# Patient Record
Sex: Female | Born: 1935 | Hispanic: Yes | Marital: Married | Smoking: Never smoker
Health system: Southern US, Community
[De-identification: ages and names within clinical notes are randomized; demographics above are authoritative.]

## PROBLEM LIST (undated history)

## (undated) DIAGNOSIS — I10 Essential (primary) hypertension: Secondary | ICD-10-CM

## (undated) DIAGNOSIS — K219 Gastro-esophageal reflux disease without esophagitis: Secondary | ICD-10-CM

---

## 2004-01-19 ENCOUNTER — Other Ambulatory Visit: Payer: Self-pay

## 2009-05-24 ENCOUNTER — Ambulatory Visit: Payer: Self-pay | Admitting: Family Medicine

## 2011-03-31 IMAGING — US US PELV - US TRANSVAGINAL
1 series · 17 of 25 positions shown · non-contrast
Comparison: none

REASON FOR EXAM: P/M Bleeding
COMMENTS:

[Series 1: us pelv - us transvaginal · 17 of 55 slices shown]
[im 1/55]
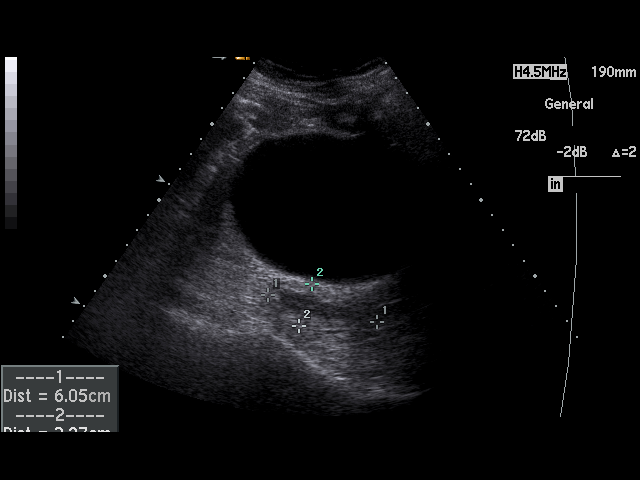
[im 5/55]
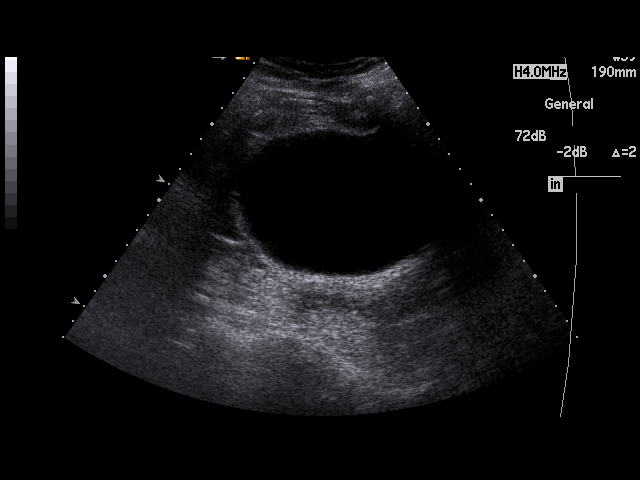
[im 7/55]
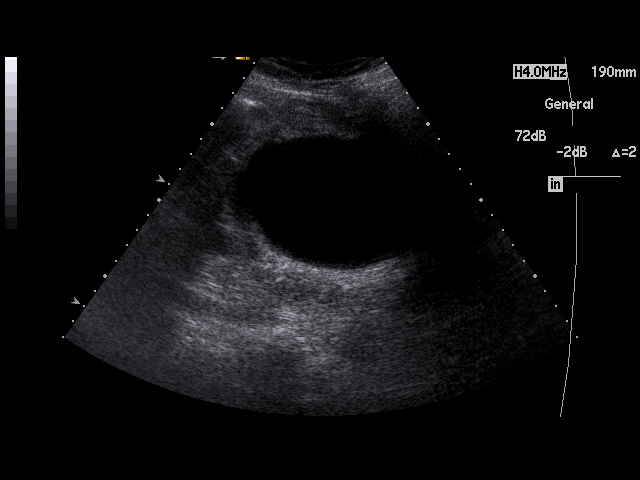
[im 12/55]
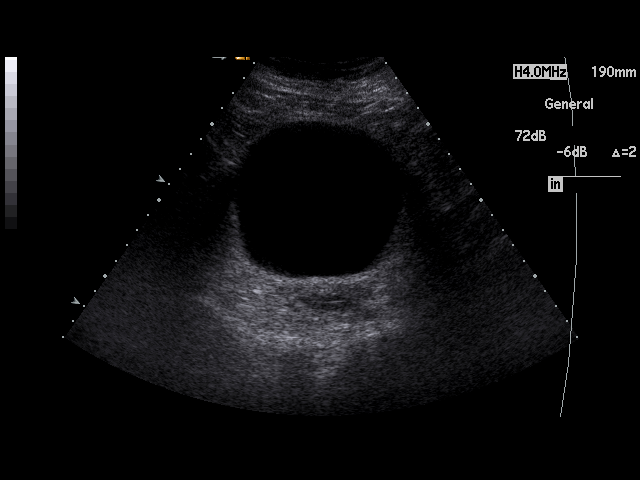
[im 14/55]
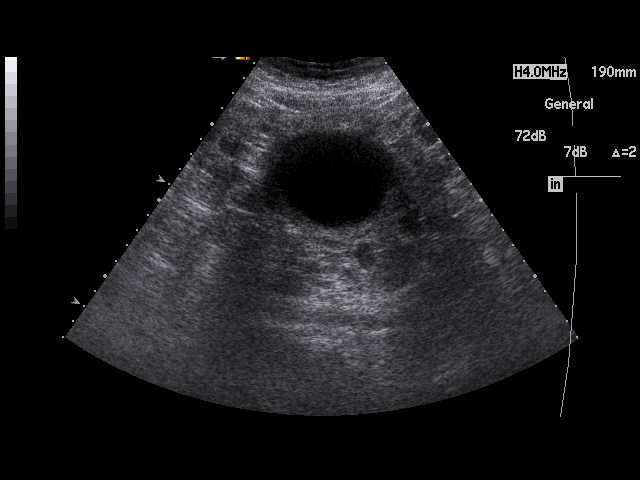
[im 19/55]
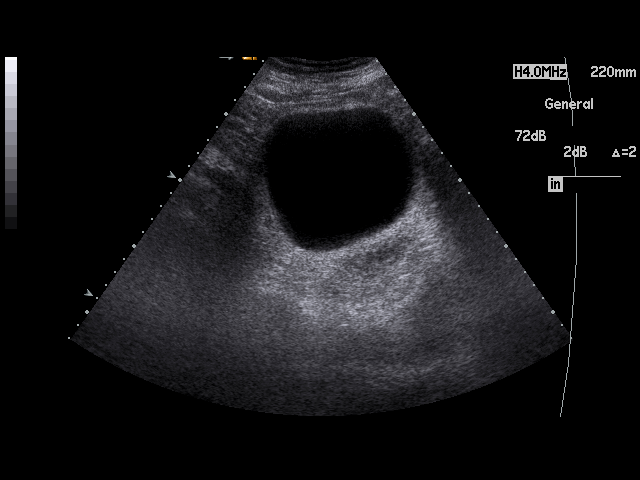
[im 21/55]
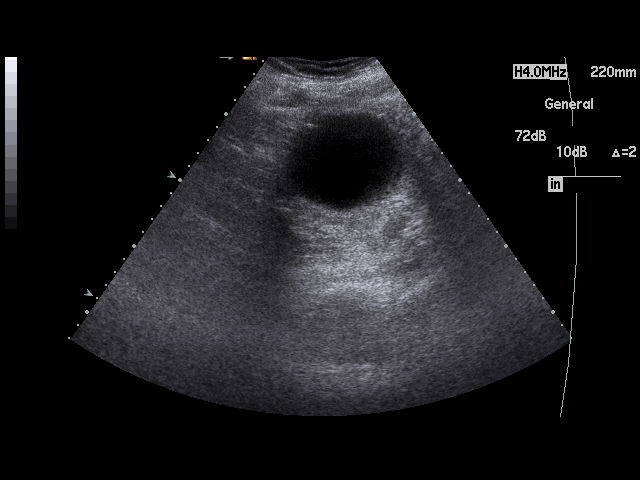
[im 25/55]
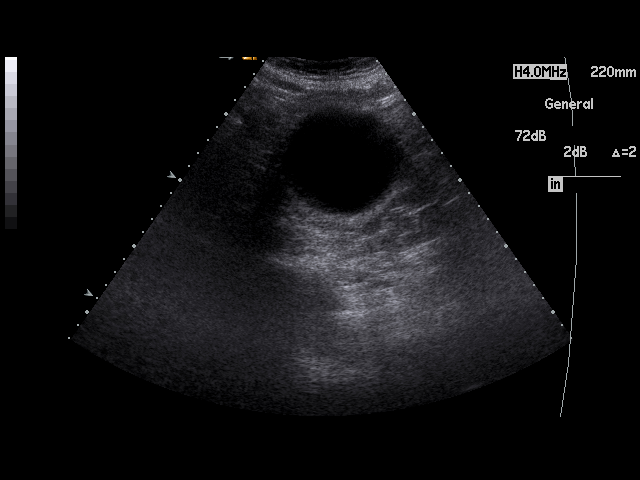
[im 28/55]
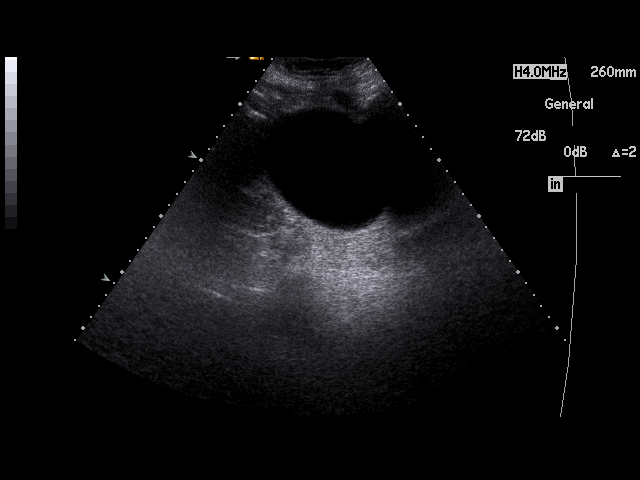
[im 30/55]
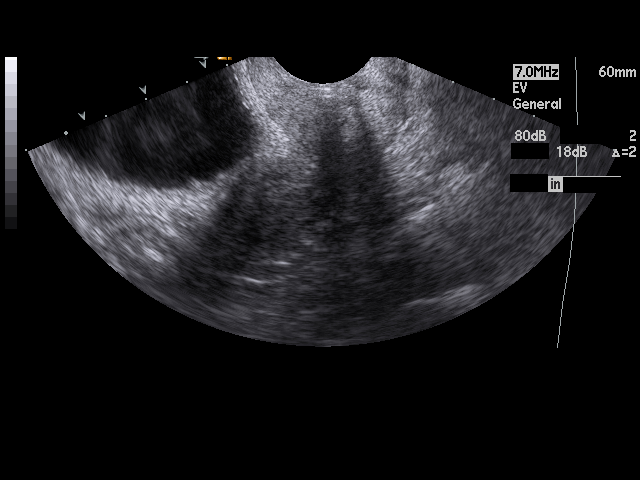
[im 34/55]
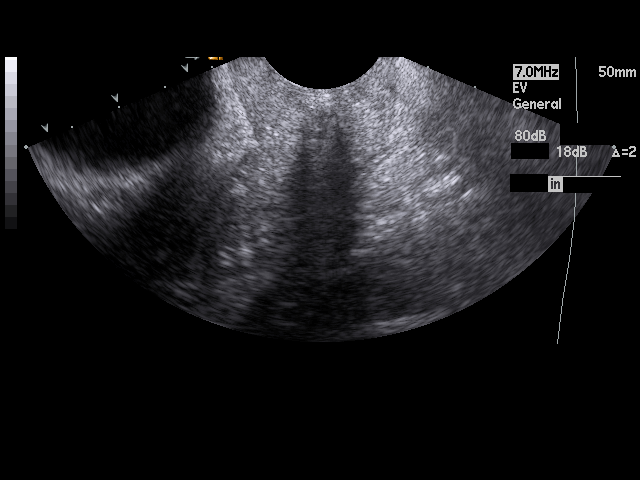
[im 37/55]
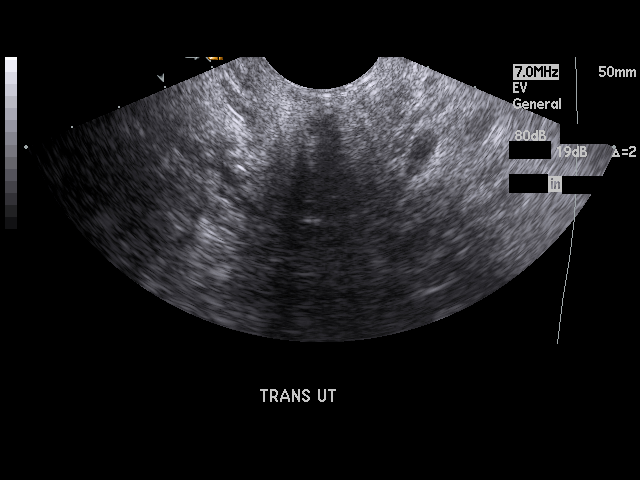
[im 41/55]
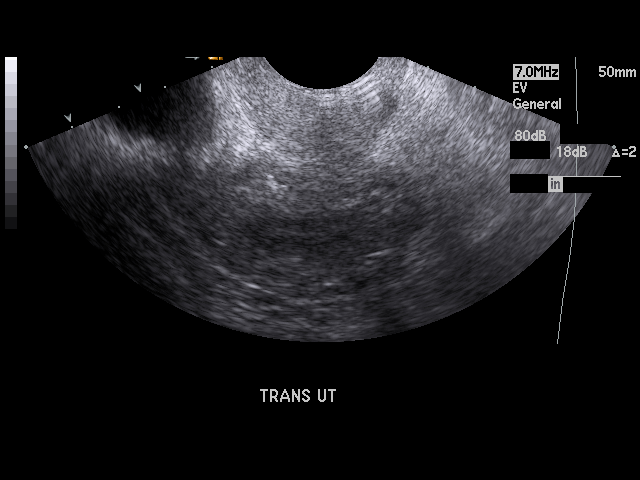
[im 43/55]
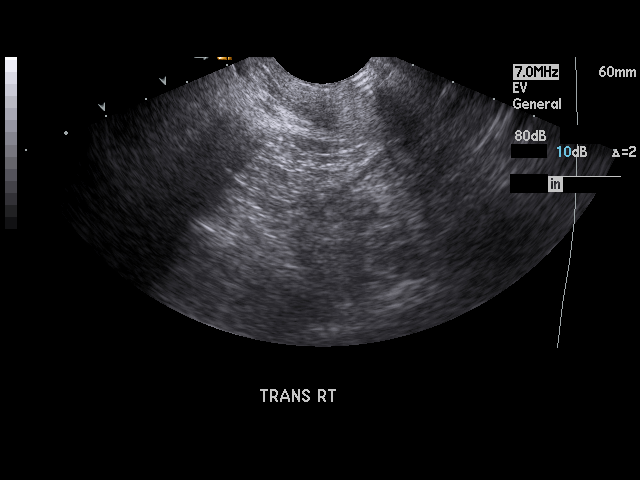
[im 48/55]
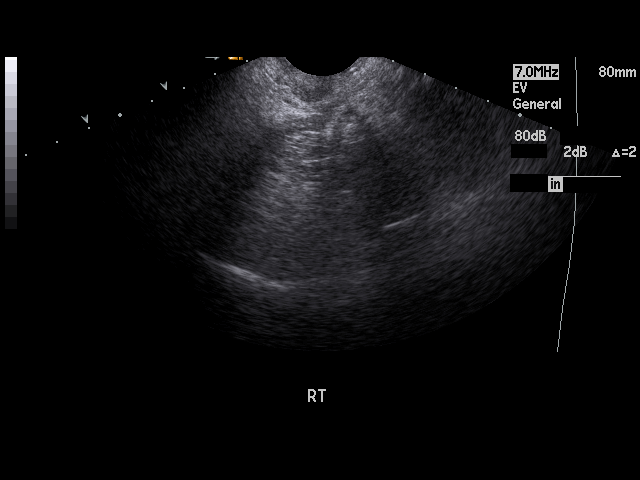
[im 50/55]
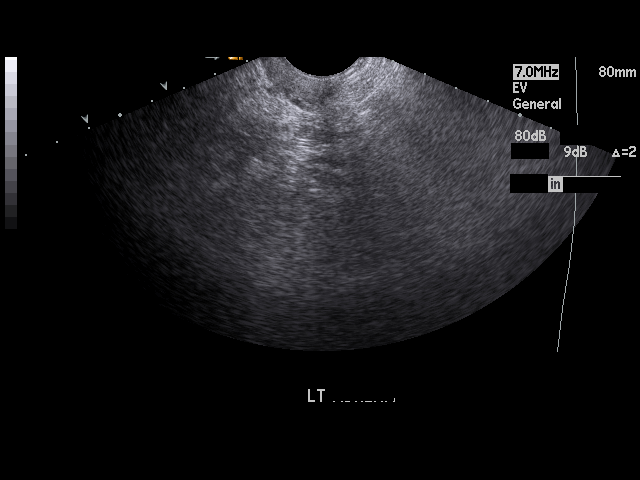
[im 55/55]
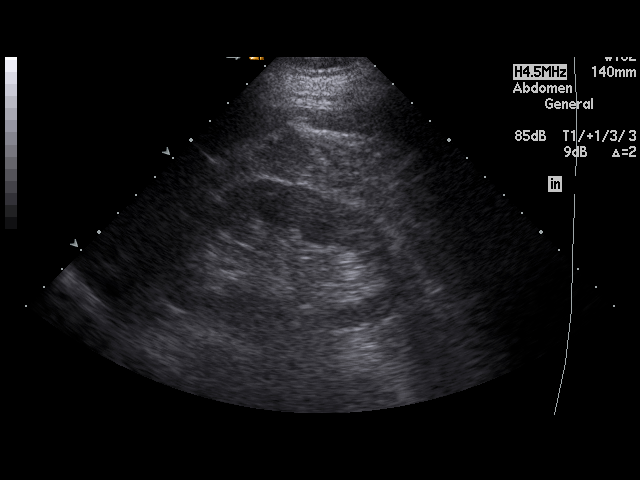

[17 of 25 positions shown; findings below may reference images not displayed]

PROCEDURE:     ALEXANDRE - ALEXANDRE PELVIC MASS EXAM/W TRANSVAGI  - May 24, 2009 [DATE]

RESULT:     Transabdominal and endovaginal ultrasound was performed. The
uterus measures 6.05 cm x 3.48 cm x 2.37 cm. No uterine mass lesions are
seen. The endometrium measures 2.1 mm in thickness which is within normal
limits. The right and left ovaries are not visualized and are apparently
atrophic or obscured. No abnormal adnexal masses are seen. There is no free
fluid in the pelvis. The visualized portion of the urinary bladder is normal
in appearance.
IMPRESSION: 1. The ovaries are not visualized and apparently are atrophic or could be
obscured.
2. No abnormal adnexal masses are seen.
3. No significant abnormalities of the uterus are identified.
4. No free fluid is seen in the pelvis.
5. The kidneys show no hydronephrosis.

## 2015-04-14 ENCOUNTER — Other Ambulatory Visit: Payer: Self-pay | Admitting: Internal Medicine

## 2015-04-14 DIAGNOSIS — R42 Dizziness and giddiness: Secondary | ICD-10-CM

## 2015-04-19 ENCOUNTER — Ambulatory Visit
Admission: RE | Admit: 2015-04-19 | Discharge: 2015-04-19 | Disposition: A | Discharge status: Home / Self Care | Payer: Self-pay | Source: Ambulatory Visit | Attending: Internal Medicine | Admitting: Internal Medicine

## 2015-04-19 DIAGNOSIS — R42 Dizziness and giddiness: Secondary | ICD-10-CM | POA: Insufficient documentation

## 2015-09-10 ENCOUNTER — Encounter: Payer: Self-pay | Admitting: Emergency Medicine

## 2015-09-10 ENCOUNTER — Emergency Department: Payer: Self-pay

## 2015-09-10 ENCOUNTER — Emergency Department
Admission: EM | Admit: 2015-09-10 | Discharge: 2015-09-10 | Disposition: A | Discharge status: Home / Self Care | Payer: Self-pay | Attending: Emergency Medicine | Admitting: Emergency Medicine

## 2015-09-10 DIAGNOSIS — I4891 Unspecified atrial fibrillation: Secondary | ICD-10-CM

## 2015-09-10 DIAGNOSIS — Z880 Allergy status to penicillin: Secondary | ICD-10-CM | POA: Insufficient documentation

## 2015-09-10 DIAGNOSIS — I10 Essential (primary) hypertension: Secondary | ICD-10-CM | POA: Insufficient documentation

## 2015-09-10 HISTORY — DX: Gastro-esophageal reflux disease without esophagitis: K21.9

## 2015-09-10 HISTORY — DX: Essential (primary) hypertension: I10

## 2015-09-10 LAB — CBC WITH DIFFERENTIAL/PLATELET
Basophils Absolute: 0 10*3/uL (ref 0–0.1)
Basophils Relative: 0 %
Eosinophils Absolute: 0.2 10*3/uL (ref 0–0.7)
Eosinophils Relative: 4 %
HEMATOCRIT: 34.3 % — AB (ref 35.0–47.0)
HEMOGLOBIN: 11 g/dL — AB (ref 12.0–16.0)
LYMPHS ABS: 0.9 10*3/uL — AB (ref 1.0–3.6)
Lymphocytes Relative: 16 %
MCH: 28.3 pg (ref 26.0–34.0)
MCHC: 32 g/dL (ref 32.0–36.0)
MCV: 88.3 fL (ref 80.0–100.0)
MONO ABS: 0.7 10*3/uL (ref 0.2–0.9)
MONOS PCT: 13 %
NEUTROS ABS: 3.6 10*3/uL (ref 1.4–6.5)
NEUTROS PCT: 67 %
Platelets: 180 10*3/uL (ref 150–440)
RBC: 3.89 MIL/uL (ref 3.80–5.20)
RDW: 17.7 % — AB (ref 11.5–14.5)
WBC: 5.5 10*3/uL (ref 3.6–11.0)

## 2015-09-10 LAB — COMPREHENSIVE METABOLIC PANEL
ALBUMIN: 3.8 g/dL (ref 3.5–5.0)
ALK PHOS: 91 U/L (ref 38–126)
ALT: 18 U/L (ref 14–54)
ANION GAP: 8 (ref 5–15)
AST: 28 U/L (ref 15–41)
BILIRUBIN TOTAL: 0.8 mg/dL (ref 0.3–1.2)
BUN: 16 mg/dL (ref 6–20)
CALCIUM: 8.9 mg/dL (ref 8.9–10.3)
CO2: 25 mmol/L (ref 22–32)
Chloride: 108 mmol/L (ref 101–111)
Creatinine, Ser: 0.86 mg/dL (ref 0.44–1.00)
GFR calc Af Amer: >60 mL/min (ref >60)
GFR calc non Af Amer: >60 mL/min (ref >60)
GLUCOSE: 113 mg/dL — AB (ref 65–99)
POTASSIUM: 4.2 mmol/L (ref 3.5–5.1)
SODIUM: 141 mmol/L (ref 135–145)
Total Protein: 6.6 g/dL (ref 6.5–8.1)

## 2015-09-10 LAB — PROTIME-INR
INR: 1.16
Prothrombin Time: 15 seconds (ref 11.4–15.0)

## 2015-09-10 LAB — TROPONIN I: Troponin I: <0.03 ng/mL (ref <0.031)

## 2015-09-10 MED ORDER — DILTIAZEM HCL 30 MG PO TABS
60.0000 mg | ORAL_TABLET | Freq: Once | ORAL | Status: AC
  Administered 2015-09-10: 60 mg via ORAL
  Filled 2015-09-10: qty 2

## 2015-09-10 MED ORDER — DILTIAZEM HCL ER COATED BEADS 120 MG PO CP24
120.0000 mg | ORAL_CAPSULE | Freq: Once | ORAL | Status: AC
  Administered 2015-09-10: 120 mg via ORAL
  Filled 2015-09-10: qty 1

## 2015-09-10 MED ORDER — SODIUM CHLORIDE 0.9 % IV BOLUS (SEPSIS)
1000.0000 mL | Freq: Once | INTRAVENOUS | Status: AC
  Administered 2015-09-10: 1000 mL via INTRAVENOUS

## 2015-09-10 MED ORDER — WARFARIN SODIUM 5 MG PO TABS
2.5000 mg | ORAL_TABLET | Freq: Once | ORAL | Status: AC
  Administered 2015-09-10: 2.5 mg via ORAL
  Filled 2015-09-10: qty 1

## 2015-09-10 MED ORDER — DILTIAZEM HCL 25 MG/5ML IV SOLN
10.0000 mg | Freq: Once | INTRAVENOUS | Status: AC
  Administered 2015-09-10: 10 mg via INTRAVENOUS
  Filled 2015-09-10: qty 5

## 2015-09-10 MED ORDER — DILTIAZEM HCL ER COATED BEADS 120 MG PO CP24: 120.0000 mg | ORAL_CAPSULE | Freq: Every day | ORAL | Status: AC

## 2015-09-10 MED ORDER — WARFARIN SODIUM 2.5 MG PO TABS: 2.5000 mg | ORAL_TABLET | Freq: Every day | ORAL | Status: AC

## 2015-09-10 NOTE — ED Provider Notes (Signed)
[facility] Emergency Department Provider Note  Time seen: 9:18 AM  I have reviewed the triage vital signs and the nursing notes.   HISTORY  Chief Complaint Tachycardia    HPI Carol Molina is a 81 y.o. female with a past medical history of hypertension, gastric reflux, and recently diagnosed atrial fibrillation, who presents the emergency department with shortness of breath and chest tightness. According to the patient last night she began feeling short of breath which chest tightness, it has continued overnight and this morning so she came to the emergency department for evaluation. Patient was recently admitted to [facility] 09/07/15 for 3 weeks of cough and found to be in atrial fibrillation with rapid ventricular response. In reviewing the patient's notes at that time the patient had a CT scan of her chest which showed no pulmonary emboli, echocardiogram which showed no significant abnormality, she was started on warfarin as well as diltiazem and discharged home the following day. Daughter states the patient has been taking her medications as prescribed.Patient continues to feel short of breath with chest tightness in the emergency department.     Past Medical History  Diagnosis Date  . Hypertension   . GERD (gastroesophageal reflux disease)     There are no active problems to display for this patient.   History reviewed. No pertinent past surgical history.  No current outpatient prescriptions on file.  Allergies Penicillins  History reviewed. No pertinent family history.  Social History Social History  Substance Use Topics  . Smoking status: Never Smoker   . Smokeless tobacco: None  . Alcohol Use: No    Review of Systems Constitutional: Negative for fever. Cardiovascular: Positive for chest tightness Respiratory: Positive shortness of breath Gastrointestinal: Negative for abdominal pain Musculoskeletal: Negative for back  pain. Neurological: Negative for headache 10-point ROS otherwise negative.  ____________________________________________   PHYSICAL EXAM:  VITAL SIGNS: ED Triage Vitals  Enc Vitals Group     BP 09/10/15 0854 117/94 mmHg     Pulse Rate 09/10/15 0854 135     Resp 09/10/15 0854 20     Temp 09/10/15 0854 98.4 F (36.9 C)     Temp Source 09/10/15 0854 Oral     SpO2 09/10/15 0854 98 %     Weight 09/10/15 0854 195 lb (88.451 kg)     Height 09/10/15 0854  (1.702 m)     Head Cir --      Peak Flow --      Pain Score 09/10/15 0855 6     Pain Loc --      Pain Edu? --      Excl. in GC? --     Constitutional: Alert. Well appearing and in no distress. Eyes: Normal exam ENT   Head: Normocephalic and atraumatic.   Mouth/Throat: Mucous membranes are moist. Cardiovascular: Rate around 130 bpm, irregular rhythm. Respiratory: Normal respiratory effort without tachypnea nor retractions. Breath sounds are clear and equal bilaterally. No wheezes/rales/rhonchi. Gastrointestinal: Soft and nontender. No distention.  Musculoskeletal: Nontender with normal range of motion in all extremities. No lower extremity edema. Neurologic:  Normal speech and language. No gross focal neurologic deficits Skin:  Skin is warm, dry and intact.  Psychiatric: Mood and affect are normal.   ____________________________________________    EKG  EKG reviewed and interpreted by myself shows atrial fibrillation at 135 bpm, narrow QRS, normal axis, normal intervals, nonspecific ST changes.  ____________________________________________    RADIOLOGY  Bibasilar atelectasis.  ____________________________________________  INITIAL IMPRESSION / ASSESSMENT AND PLAN / ED COURSE  Pertinent labs & imaging results that were available during my care of the patient were reviewed by me and considered in my medical decision making (see chart for details).  Patient presents for dyspnea and chest tightness  since last night. EKG consistent with atrial fibrillation with rapid ventricular response. [facility] diagnosed with atrial fibrillation started on diltiazem as well as warfarin. We will check labs including PT/INR, treat with IV diltiazem as well as by mouth diltiazem, and closely monitor in the emergency department. I suspect her symptoms are related to her A. fib with RVR. Patient had a negative CTA, had an echocardiogram, with [facility].  Patient states her symptoms are gone. She feels much better, rate is controlled around 80-90 5 bpm, blood pressures improved. We will discharge the patient home on her medications. Patient states she will get her prescriptions filled tomorrow. We have dosed her diltiazem and warfarin in the emergency department. Patient is follow up with her primary care doctor, and also given the patient cut out to follow up information.  ____________________________________________   FINAL CLINICAL IMPRESSION(S) / ED DIAGNOSES  Atrial fibrillation fibrillation with rapid ventricular response   Minna AntisKevin Kizer Nobbe, MD 09/10/15 1344

## 2015-09-10 NOTE — ED Notes (Signed)
Pt to ed with family who report pt had severe cough and sob last night.  States she was [facility].  Pt states she does not feel any better.

## 2015-09-10 NOTE — ED Notes (Addendum)
Per pt's family, [facility] and stayed overnight observation for similar symptoms. Pt diagnosed at that time with URI and a-fib with RVR which was controlled with cardizem during her stay. Pt's family is concerned she may be developing pneumonia d/t having PMH of it.

## 2015-09-10 NOTE — Discharge Instructions (Signed)
Please call the number provided for cardiology Monday morning to arrange a follow-up appointment. Please fill and begin taking her medications as prescribed. Return to the emergency department for any further shortness of breath, chest pain, or any other symptoms personally concerning to your self.    Fibrilacin auricular (Atrial Fibrillation) La fibrilacin auricular es un tipo de latido cardaco irregular o rpido. En las personas que tienen esta afeccin, el corazn sigue temblando de un modo extrao (catico) y a causa de esto, no puede bombear la sangre con normalidad. La afeccin aumenta el riesgo de una persona de tener ictus, insuficiencia cardaca y otros problemas del corazn. Hay diferentes tipos de fibrilacin auricular. Hable con el mdico para saber cul es el que usted padece. CUIDADOS EN EL HOGAR  Tome los medicamentos de venta libre y los recetados solamente como se lo haya indicado el mdico.  Si el mdico le recet un anticoagulante, tmelo exactamente como se lo haya indicado. Tomar dosis excesivas de este medicamento puede causar hemorragias. Si no toma las dosis suficientes, no tendr la proteccin necesaria contra el ictus y Ecolabotros problemas.  No use productos que contengan tabaco. Estos incluyen cigarrillos, tabaco para mascar y Administrator, Civil Servicecigarrillos electrnicos. Si necesita ayuda para dejar de fumar, consulte al mdico.  Si tiene apnea (apnea obstructiva del sueo), trate la afeccin como se lo haya indicado el mdico.  No beba alcohol.  No tome bebidas que contengan cafena, como caf, refrescos y t.  Mantenga un peso saludable. No tome pastillas para adelgazar a menos que el mdico considere que puede hacerlo sin correr Herbalistriesgos. Estas pueden agravar los problemas cardacos.  Siga su dieta segn las indicaciones de su mdico.  Haga ejercicios con regularidad tal como le indic el mdico.  Concurra a todas las visitas de control como se lo haya indicado el mdico. Esto es  importante. SOLICITE AYUDA SI:  Nota un cambio en la velocidad, el ritmo o la fuerza de los latidos cardacos.  Est tomando un anticoagulante y Milus Glaziernota una mayor cantidad de hematomas.  Se cansa con ms facilidad cuando se mueve o hace actividad fsica. SOLICITE AYUDA DE INMEDIATO SI:  Siente un dolor intenso en el pecho o el abdomen.  Tiene sudoracin o debilidad.  Siente malestar estomacal (nuseas).  Observa sangre en el vmito, las heces o la [place].  Comienza a sentir falta de aire.  Se le hinchan los pies y los tobillos repentinamente.  Siente mareos.  Siente debilidad o adormecimiento sbitos en el rostro, los brazos o las piernas, especialmente si esto ocurre de un lado del cuerpo.  Tiene dificultad para hablar, para comprender o para ambas cosas.  El rostro o el prpado se le caen hacia un lado. Estos sntomas pueden Customer service managerindicar una emergencia. No espere hasta que los sntomas desaparezcan. Solicite atencin mdica de inmediato. Comunquese con el servicio de emergencias de su localidad (911 en los [place]). No conduzca por sus propios medios OfficeMax Incorporatedhasta el hospital.   Esta informacin no tiene Theme park managercomo fin reemplazar el consejo del mdico. Asegrese de hacerle al mdico cualquier pregunta que tenga.   Document Released: 12/07/2008 Document Revised: 06/01/2015 Elsevier Interactive Patient Education [employer] Elsevier Inc.

## 2017-07-17 IMAGING — CR DG CHEST 1V PORT
1 series · 1 of 1 positions shown · non-contrast
Comparison: None.

CLINICAL DATA: Cough and shortness of breath.

EXAM:
PORTABLE CHEST 1 VIEW

[ap]
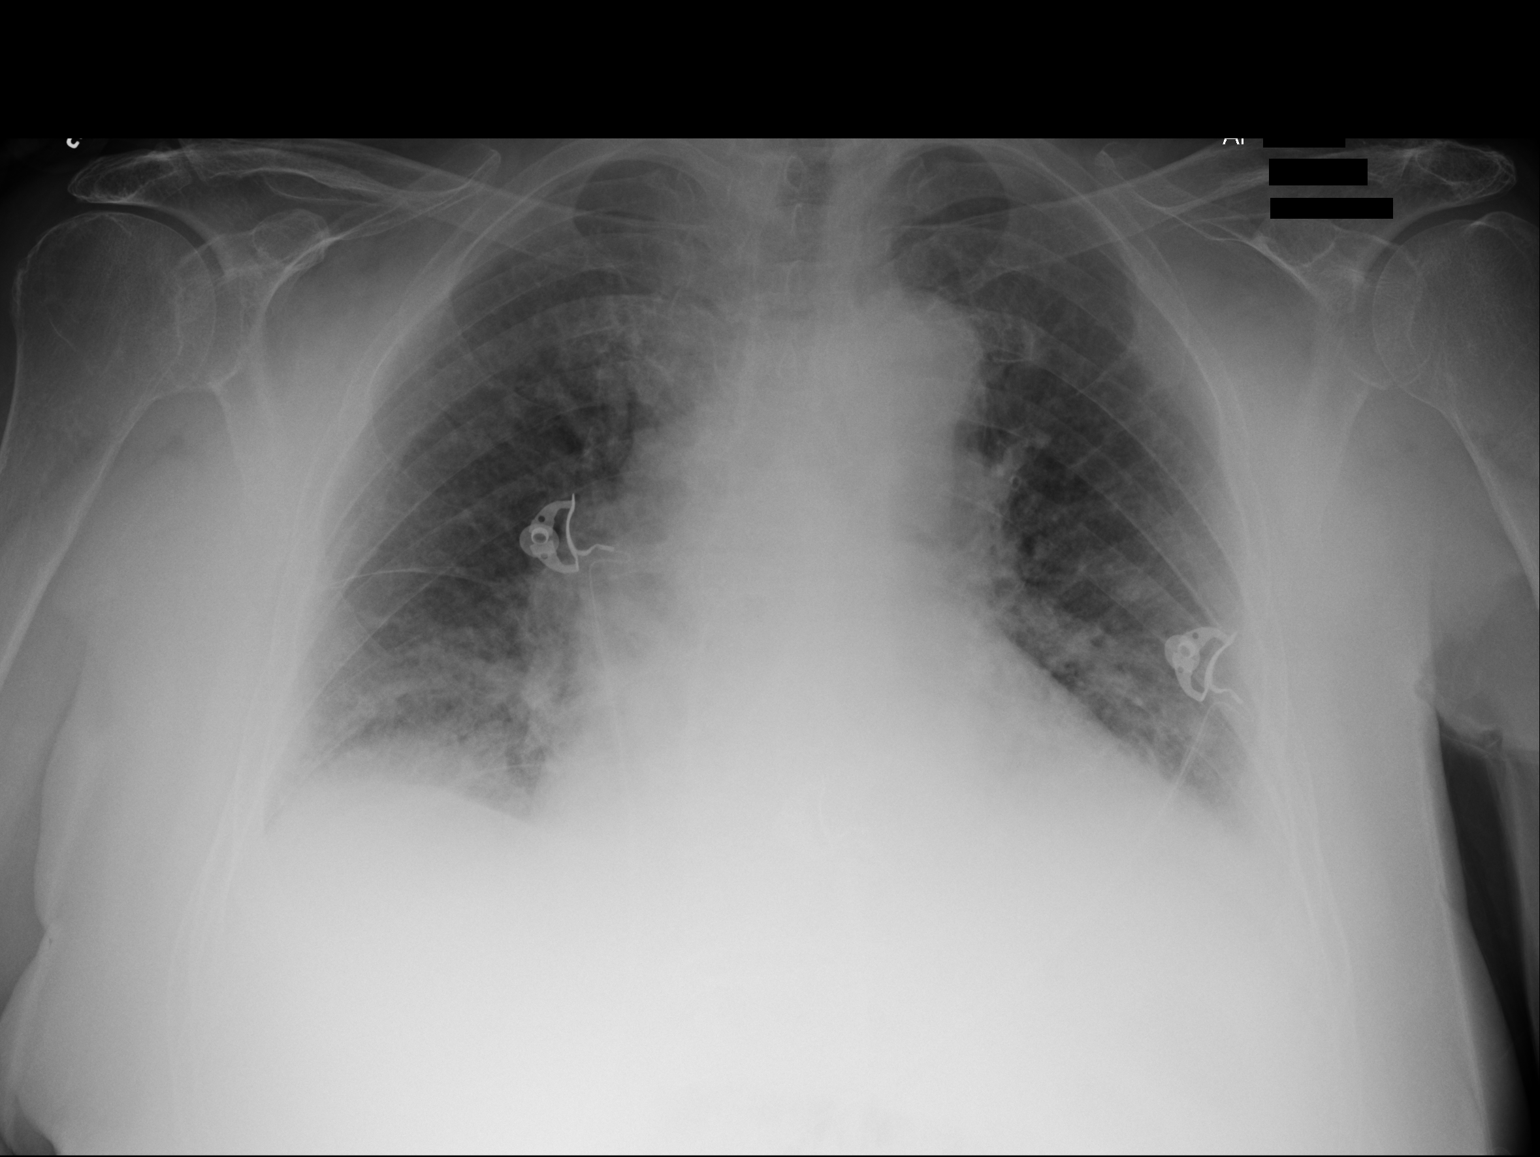

[1 of 1 positions shown; findings below may reference images not displayed]

FINDINGS: Bibasilar opacities present, right greater than left consistent with
either pneumonia or atelectasis. Interstitial and pulmonary vascular
prominence present without overt edema. There may be a component of
mild interstitial edema. No significant pleural fluid identified.
The heart is mildly enlarged.
IMPRESSION: Bibasilar infiltrates versus atelectasis, right greater than left.
There may be also a component of mild interstitial edema. The heart
is mildly enlarged.
